# Patient Record
Sex: Female | Born: 1965 | Race: Black or African American | Hispanic: No | Marital: Married | Smoking: Never smoker
Health system: Southern US, Community
[De-identification: ages and names within clinical notes are randomized; demographics above are authoritative.]

## PROBLEM LIST (undated history)

## (undated) HISTORY — PX: CHOLECYSTECTOMY: SHX55

---

## 1997-06-25 ENCOUNTER — Inpatient Hospital Stay (HOSPITAL_COMMUNITY): Admission: AD | Admit: 1997-06-25 | Discharge: 1997-06-25 | Payer: Self-pay | Admitting: Obstetrics & Gynecology

## 1997-06-27 ENCOUNTER — Other Ambulatory Visit: Admission: RE | Admit: 1997-06-27 | Discharge: 1997-06-27 | Payer: Self-pay | Admitting: Obstetrics & Gynecology

## 1997-07-15 ENCOUNTER — Other Ambulatory Visit: Admission: RE | Admit: 1997-07-15 | Discharge: 1997-07-15 | Payer: Self-pay | Admitting: Obstetrics and Gynecology

## 1997-08-03 ENCOUNTER — Other Ambulatory Visit: Admission: RE | Admit: 1997-08-03 | Discharge: 1997-08-03 | Payer: Self-pay | Admitting: Obstetrics and Gynecology

## 1997-08-24 ENCOUNTER — Other Ambulatory Visit: Admission: RE | Admit: 1997-08-24 | Discharge: 1997-08-24 | Payer: Self-pay | Admitting: Obstetrics and Gynecology

## 1998-02-28 ENCOUNTER — Other Ambulatory Visit: Admission: RE | Admit: 1998-02-28 | Discharge: 1998-02-28 | Payer: Self-pay | Admitting: Obstetrics and Gynecology

## 1998-04-17 ENCOUNTER — Inpatient Hospital Stay (HOSPITAL_COMMUNITY): Admission: AD | Admit: 1998-04-17 | Discharge: 1998-04-17 | Payer: Self-pay | Admitting: Obstetrics and Gynecology

## 1998-04-17 ENCOUNTER — Encounter: Payer: Self-pay | Admitting: Obstetrics and Gynecology

## 1998-06-20 ENCOUNTER — Encounter: Payer: Self-pay | Admitting: Obstetrics and Gynecology

## 1998-06-20 ENCOUNTER — Ambulatory Visit (HOSPITAL_COMMUNITY): Admission: RE | Admit: 1998-06-20 | Discharge: 1998-06-20 | Payer: Self-pay | Admitting: Obstetrics and Gynecology

## 1998-11-09 ENCOUNTER — Inpatient Hospital Stay (HOSPITAL_COMMUNITY): Admission: AD | Admit: 1998-11-09 | Discharge: 1998-11-11 | Payer: Self-pay | Admitting: *Deleted

## 1998-11-12 ENCOUNTER — Encounter (HOSPITAL_COMMUNITY): Admission: RE | Admit: 1998-11-12 | Discharge: 1999-02-10 | Payer: Self-pay | Admitting: *Deleted

## 1999-02-13 ENCOUNTER — Encounter (HOSPITAL_COMMUNITY): Admission: RE | Admit: 1999-02-13 | Discharge: 1999-05-14 | Payer: Self-pay | Admitting: *Deleted

## 1999-03-22 ENCOUNTER — Emergency Department (HOSPITAL_COMMUNITY): Admission: EM | Admit: 1999-03-22 | Discharge: 1999-03-22 | Payer: Self-pay | Admitting: Emergency Medicine

## 1999-05-16 ENCOUNTER — Encounter: Admission: RE | Admit: 1999-05-16 | Discharge: 1999-08-14 | Payer: Self-pay | Admitting: *Deleted

## 1999-06-24 ENCOUNTER — Emergency Department (HOSPITAL_COMMUNITY): Admission: EM | Admit: 1999-06-24 | Discharge: 1999-06-24 | Payer: Self-pay | Admitting: Emergency Medicine

## 1999-09-13 ENCOUNTER — Encounter: Admission: RE | Admit: 1999-09-13 | Discharge: 1999-12-12 | Payer: Self-pay | Admitting: *Deleted

## 2016-09-27 ENCOUNTER — Encounter (HOSPITAL_COMMUNITY): Payer: Self-pay

## 2016-09-27 ENCOUNTER — Emergency Department (HOSPITAL_COMMUNITY)
Admission: EM | Admit: 2016-09-27 | Discharge: 2016-09-27 | Disposition: A | Discharge status: Home / Self Care | Payer: Private Health Insurance - Indemnity | Attending: Emergency Medicine | Admitting: Emergency Medicine

## 2016-09-27 ENCOUNTER — Emergency Department (HOSPITAL_COMMUNITY): Payer: Private Health Insurance - Indemnity

## 2016-09-27 DIAGNOSIS — Y929 Unspecified place or not applicable: Secondary | ICD-10-CM | POA: Insufficient documentation

## 2016-09-27 DIAGNOSIS — X500XXA Overexertion from strenuous movement or load, initial encounter: Secondary | ICD-10-CM | POA: Diagnosis not present

## 2016-09-27 DIAGNOSIS — S46912A Strain of unspecified muscle, fascia and tendon at shoulder and upper arm level, left arm, initial encounter: Secondary | ICD-10-CM | POA: Diagnosis not present

## 2016-09-27 DIAGNOSIS — Y9389 Activity, other specified: Secondary | ICD-10-CM | POA: Diagnosis not present

## 2016-09-27 DIAGNOSIS — R030 Elevated blood-pressure reading, without diagnosis of hypertension: Secondary | ICD-10-CM | POA: Diagnosis not present

## 2016-09-27 DIAGNOSIS — Z9049 Acquired absence of other specified parts of digestive tract: Secondary | ICD-10-CM | POA: Insufficient documentation

## 2016-09-27 DIAGNOSIS — Y998 Other external cause status: Secondary | ICD-10-CM | POA: Diagnosis not present

## 2016-09-27 DIAGNOSIS — S40912A Unspecified superficial injury of left shoulder, initial encounter: Secondary | ICD-10-CM | POA: Diagnosis present

## 2016-09-27 NOTE — ED Triage Notes (Signed)
Patient came in with Left shoulder pain. She has had this shoulder pain on and off for a year, however she was cleaning yesterday and last night it was "excruciating". She c/o pain that is worse with movement.

## 2016-09-27 NOTE — Discharge Instructions (Signed)
Place an ice pack on your left shoulder 4 times daily for 30 minutes at a time. Take Advil as directed for pain. It is important to try to move your shoulder as much as possible, otherwise it will become stiff. If having significant pain in a week, contact your primary care physician to arrange to be seen. You should get your blood pressure recheck within the next 3 weeks. Today's was elevated at 147/94

## 2016-09-27 NOTE — ED Provider Notes (Signed)
[facility]-EMERGENCY DEPT Provider Note   CSN: [id number] Arrival date & time: 09/27/16  0814     History   Chief Complaint Chief Complaint  Patient presents with  . Shoulder Pain    Left    HPI Zoe Dyer is a 51 y.o. female.  HPI complains of left shoulder pain intermittently for one year. Her shoulder pain became worse on July 4 when she moved a box. Pain is left shoulder nonradiating. Worse with movement improved with remaining still. No other associated symptoms. No treatment prior to coming.  History reviewed. No pertinent past medical history.  There are no active problems to display for this patient.   Past Surgical History:  Procedure Laterality Date  . CHOLECYSTECTOMY      OB History    No data available       Home Medications    Prior to Admission medications   Not on File    Family History History reviewed. No pertinent family history.  Social History Social History  Substance Use Topics  . Smoking status: Never Smoker  . Smokeless tobacco: Never Used  . Alcohol use Yes     Comment: occasionally/socially    No illicit drug use Allergies   Other; Shellfish allergy; and Sudafed [pseudoephedrine hcl] Sudafed causes heart to race  Review of Systems Review of Systems  Constitutional: Negative.   Respiratory: Negative.   Cardiovascular: Negative.   Musculoskeletal: Positive for arthralgias.       Left shoulder pain     Physical Exam Updated Vital Signs BP (!) 148/104 (BP Location: Right Arm)   Pulse 81   Temp 98.2 F (36.8 C) (Oral)   Resp 16   Ht 5\' 4"  (1.626 m)   Wt 104.3 kg (230 lb)   SpO2 100%   BMI 39.48 kg/m   Physical Exam  Constitutional: She is oriented to person, place, and time. She appears well-developed and well-nourished.  HENT:  Head: Normocephalic and atraumatic.  Eyes: EOM are normal.  Neck: Neck supple.  Cardiovascular: Normal rate.   Murmur heard. Pulmonary/Chest: Breath sounds normal.  Abdominal:  She exhibits no distension.  Musculoskeletal:  Left upper extremity tender at shoulder. Limited range of motion shoulder secondary to pain. No swelling no deformity radial pulse 2+ good capillary refill. All other extremities without tenderness or deformity, neurovascularly intact  Neurological: She is oriented to person, place, and time.  Skin: Skin is warm and dry. Capillary refill takes less than 2 seconds.  Psychiatric: She has a normal mood and affect.  Vitals reviewed.    ED Treatments / Results  Labs (all labs ordered are listed, but only abnormal results are displayed) Labs Reviewed - No data to display  EKG  EKG Interpretation None       Radiology No results found.  Procedures Procedures (including critical care time)  Medications Ordered in ED Medications - No data to display X-ray viewed by me No results found for this or any previous visit. Dg Shoulder Left  Result Date: 09/27/2016 CLINICAL DATA:  Pulling sensation left shoulder. EXAM: LEFT SHOULDER - 2+ VIEW COMPARISON:  No recent prior . FINDINGS: No acute bony or joint abnormality identified. No evidence of fracture or dislocation. IMPRESSION: No acute abnormality. Electronically Signed   By: Maisie Fushomas  Register   On: 09/27/2016 09:14    Initial Impression / Assessment and Plan / ED Course  I have reviewed the triage vital signs and the nursing notes.  Pertinent labs & imaging results that  were available during my care of the patient were reviewed by me and considered in my medical decision making (see chart for details).     Declines pain medicine Plan ice. Ibuprofen. Follow up with PMD if not improved in a week. Blood pressure recheck 3 weeks Final Clinical Impressions(s) / ED Diagnoses  Diagnosis #1 left shoulder strain #2 elevated blood pressure Final diagnoses:  None    New Prescriptions New Prescriptions   No medications on file     Doug Sou, MD 09/27/16 [phone]

## 2019-09-02 ENCOUNTER — Ambulatory Visit
Admission: RE | Admit: 2019-09-02 | Discharge: 2019-09-02 | Disposition: A | Discharge status: Home / Self Care | Payer: Private Health Insurance - Indemnity | Source: Ambulatory Visit | Attending: Family Medicine | Admitting: Family Medicine

## 2019-09-02 ENCOUNTER — Other Ambulatory Visit: Payer: Self-pay | Admitting: Family Medicine

## 2019-09-02 DIAGNOSIS — M25562 Pain in left knee: Secondary | ICD-10-CM

## 2021-03-23 ENCOUNTER — Other Ambulatory Visit: Payer: Self-pay

## 2021-03-23 ENCOUNTER — Emergency Department (HOSPITAL_BASED_OUTPATIENT_CLINIC_OR_DEPARTMENT_OTHER)
Admission: EM | Admit: 2021-03-23 | Discharge: 2021-03-23 | Disposition: A | Discharge status: Home / Self Care | Payer: 59 | Attending: Emergency Medicine | Admitting: Emergency Medicine

## 2021-03-23 ENCOUNTER — Encounter (HOSPITAL_BASED_OUTPATIENT_CLINIC_OR_DEPARTMENT_OTHER): Payer: Self-pay | Admitting: Emergency Medicine

## 2021-03-23 ENCOUNTER — Emergency Department (HOSPITAL_BASED_OUTPATIENT_CLINIC_OR_DEPARTMENT_OTHER): Payer: 59 | Admitting: Radiology

## 2021-03-23 DIAGNOSIS — M25511 Pain in right shoulder: Secondary | ICD-10-CM | POA: Insufficient documentation

## 2021-03-23 DIAGNOSIS — X500XXA Overexertion from strenuous movement or load, initial encounter: Secondary | ICD-10-CM | POA: Insufficient documentation

## 2021-03-23 MED ORDER — IBUPROFEN 600 MG PO TABS: 600.0000 mg | ORAL_TABLET | Freq: Four times a day (QID) | ORAL | 0 refills | Status: AC | PRN

## 2021-03-23 NOTE — ED Provider Notes (Signed)
[facility] EMERGENCY DEPT Provider Note   CSN: [id number] Arrival date & time: 03/23/21  7829     History Chief Complaint  Patient presents with   Shoulder Pain    Zoe Dyer is a 55 y.o. female with no significant past medical history who presents to the ED complaining of right shoulder pain x3-4 days.  Patient notes that yesterday she lifted a heavy container of juice which worsened her symptoms.  Patient denies injury, trauma. Pt is right hand dominant. Has tried ice with no relief for her symptoms.  Denies fever, chills, color change, wound, elbow pain, wrist pain.   The history is provided by the patient. No language interpreter was used.  Shoulder Pain Location:  Shoulder Shoulder location:  R shoulder Injury: no   Pain details:    Quality:  Unable to specify   Radiates to:  Does not radiate   Severity:  Mild   Onset quality:  Gradual   Duration:  4 days   Timing:  Intermittent   Progression:  Unchanged Handedness:  Right-handed Dislocation: no   Foreign body present:  No foreign bodies Prior injury to area:  No Relieved by:  None tried Worsened by:  Movement Ineffective treatments:  Ice Associated symptoms: decreased range of motion   Associated symptoms: no fever and no swelling       History reviewed. No pertinent past medical history.  There are no problems to display for this patient.   Past Surgical History:  Procedure Laterality Date   CHOLECYSTECTOMY       OB History   No obstetric history on file.     No family history on file.  Social History   Tobacco Use   Smoking status: Never   Smokeless tobacco: Never  Vaping Use   Vaping Use: Never used  Substance Use Topics   Alcohol use: Yes    Comment: occasionally/socially   Drug use: No    Home Medications Prior to Admission medications   Medication Sig Start Date End Date Taking? Authorizing Provider  ibuprofen (ADVIL) 600 MG tablet Take 1 tablet (600 mg  total) by mouth every 6 (six) hours as needed. 03/23/21  Yes Takeria Marquina A, PA-C    Allergies    Other, Shellfish allergy, and Sudafed [pseudoephedrine hcl]  Review of Systems   Review of Systems  Constitutional:  Negative for chills and fever.  Gastrointestinal:  Negative for nausea and vomiting.  Musculoskeletal:  Positive for arthralgias. Negative for joint swelling.  Skin:  Negative for color change, rash and wound.  All other systems reviewed and are negative.  Physical Exam Updated Vital Signs BP (!) 180/107 (BP Location: Right Arm)    Pulse 78    Temp 98.8 F (37.1 C) (Oral)    Resp 17    Ht 5\' 4"  (1.626 m)    Wt 108.9 kg    SpO2 100%    BMI 41.20 kg/m   Physical Exam Vitals and nursing note reviewed.  Constitutional:      General: She is not in acute distress.    Appearance: Normal appearance.  Eyes:     General: No scleral icterus.    Extraocular Movements: Extraocular movements intact.  Cardiovascular:     Rate and Rhythm: Normal rate.  Pulmonary:     Effort: Pulmonary effort is normal. No respiratory distress.  Musculoskeletal:     Right shoulder: Tenderness present. No swelling, deformity, laceration, bony tenderness or crepitus. Decreased range of motion. Normal  strength. Normal pulse.     Left shoulder: Normal.     Cervical back: Neck supple.     Comments: Tenderness to palpation to right bicep tendon.  Decreased range of motion of right shoulder secondary to pain.  No overlying skin changes.  Radial pulses intact bilaterally.  Strength and sensation intact to bilateral upper extremities.  Skin:    General: Skin is warm and dry.     Findings: No bruising, erythema or rash.  Neurological:     Mental Status: She is alert.  Psychiatric:        Behavior: Behavior normal.    ED Results / Procedures / Treatments   Labs (all labs ordered are listed, but only abnormal results are displayed) Labs Reviewed - No data to display  EKG None  Radiology DG  Shoulder Right  Result Date: 03/23/2021 CLINICAL DATA:  Right shoulder pain for the past 5 days.  No injury. EXAM: RIGHT SHOULDER - 2+ VIEW COMPARISON:  None. FINDINGS: No acute fracture or dislocation. Joint spaces are preserved. 1 cm calcification overlying the humeral head in internal rotation, not clearly visualized on the transscapular view. Soft tissues are unremarkable. IMPRESSION: 1. 1 cm calcification overlying the humeral head could reflect calcific tendinitis. 2. No acute osseous abnormality or significant degenerative changes. Electronically Signed   By: Obie Dredge M.D.   On: 03/23/2021 11:59    Procedures Procedures   Medications Ordered in ED Medications - No data to display  ED Course  I have reviewed the triage vital signs and the nursing notes.  Pertinent labs & imaging results that were available during my care of the patient were reviewed by me and considered in my medical decision making (see chart for details).    MDM Rules/Calculators/A&P                         Patient with right shoulder pain onset 3-4 days.  Patient lifted heavy item yesterday which exacerbated her right shoulder pain. On exam, patient with tenderness to palpation to right bicep tendon.  Decreased range of motion of right shoulder secondary to pain.  No overlying skin changes.  Strength and sensation intact to bilateral upper and lower extremities.  Radial pulses intact bilaterally. Differential diagnosis includes strain, arthritis, fracture, dislocation, tendinitis.  Right shoulder xray notable for tendinitis and negative for acute fractures or dislocations.  Patient afebrile, less likely septic arthritis.  Less likely osteoarthritis due to no radiographic findings.  This is likely acute tendinitis of right shoulder.    Information for on-call orthopedist given to patient today.  Prescription for ibuprofen given.  Supportive care measures and strict return precautions discussed with patient today.   Patient acknowledges and verbalized understanding.  Patient appears safe for discharge at this time.  Follow-up as indicated in discharge paperwork.   Final Clinical Impression(s) / ED Diagnoses Final diagnoses:  Acute pain of right shoulder    Rx / DC Orders ED Discharge Orders          Ordered    ibuprofen (ADVIL) 600 MG tablet  Every 6 hours PRN        03/23/21 1221             Alto Gandolfo A, PA-C 03/23/21 1302    Terald Sleeper, MD 03/23/21 [phone]

## 2021-03-23 NOTE — Discharge Instructions (Addendum)
Your shoulder x-ray today was negative for fracture or dislocation in the ED.  You will be prescribed ibuprofen, take as directed.  You may apply ice to the affected area for 15 minutes at a time.  Ensure to place a barrier between your skin and the ice.  Take as directed.  You may follow-up with your primary care provider as needed.  Attached is information for the on-call orthopedist, call today and set up follow-up appointment regarding your symptoms.  Return to the ED if you are experiencing increasing/worsening fever, inability to move shoulder, or worsening symptoms.

## 2021-03-23 NOTE — ED Triage Notes (Signed)
R shoulder pain x 3 days. No known injury but did carry something heavy. States her ROM is decreased.

## 2022-06-27 IMAGING — DX DG SHOULDER 2+V*R*
2 series · 2 of 2 positions shown · non-contrast
Comparison: None.

CLINICAL DATA: Right shoulder pain for the past 5 days.  No injury.

EXAM:
RIGHT SHOULDER - 2+ VIEW

[shoulder grashey]
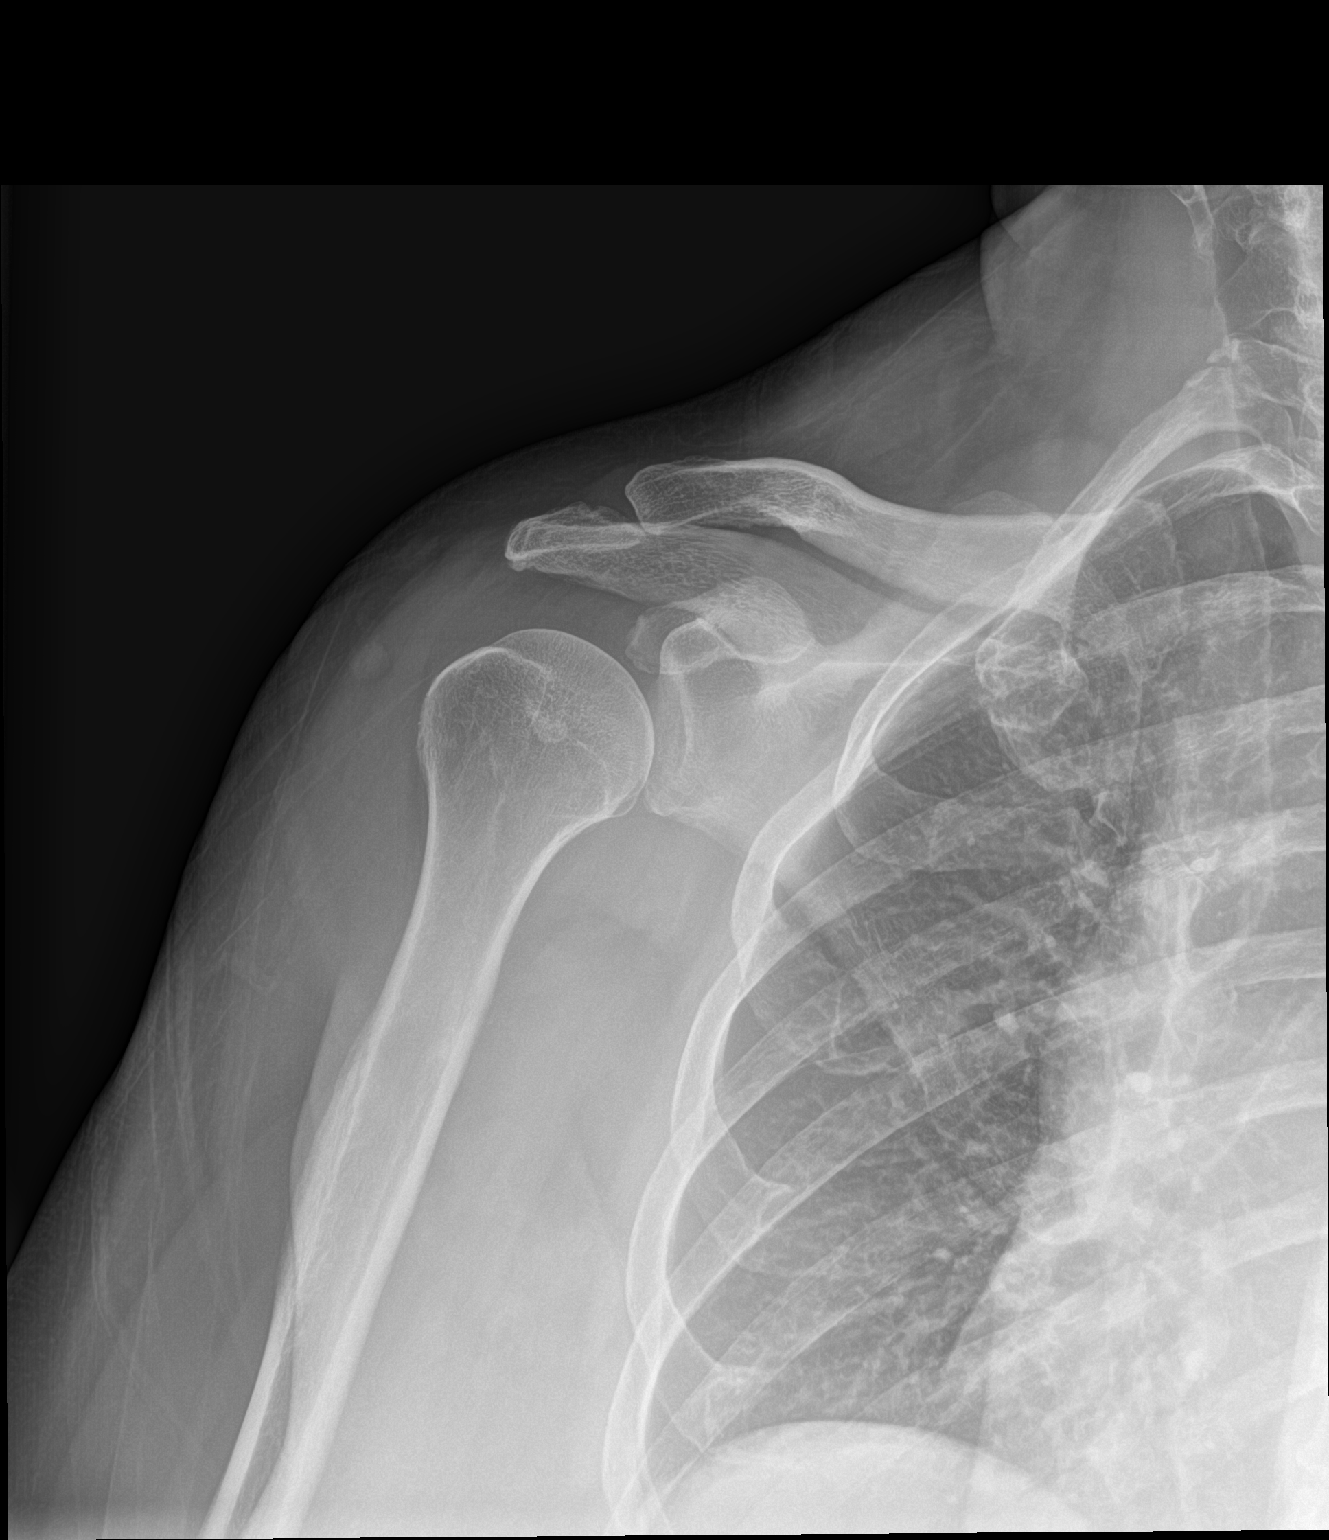

[shoulder y view]
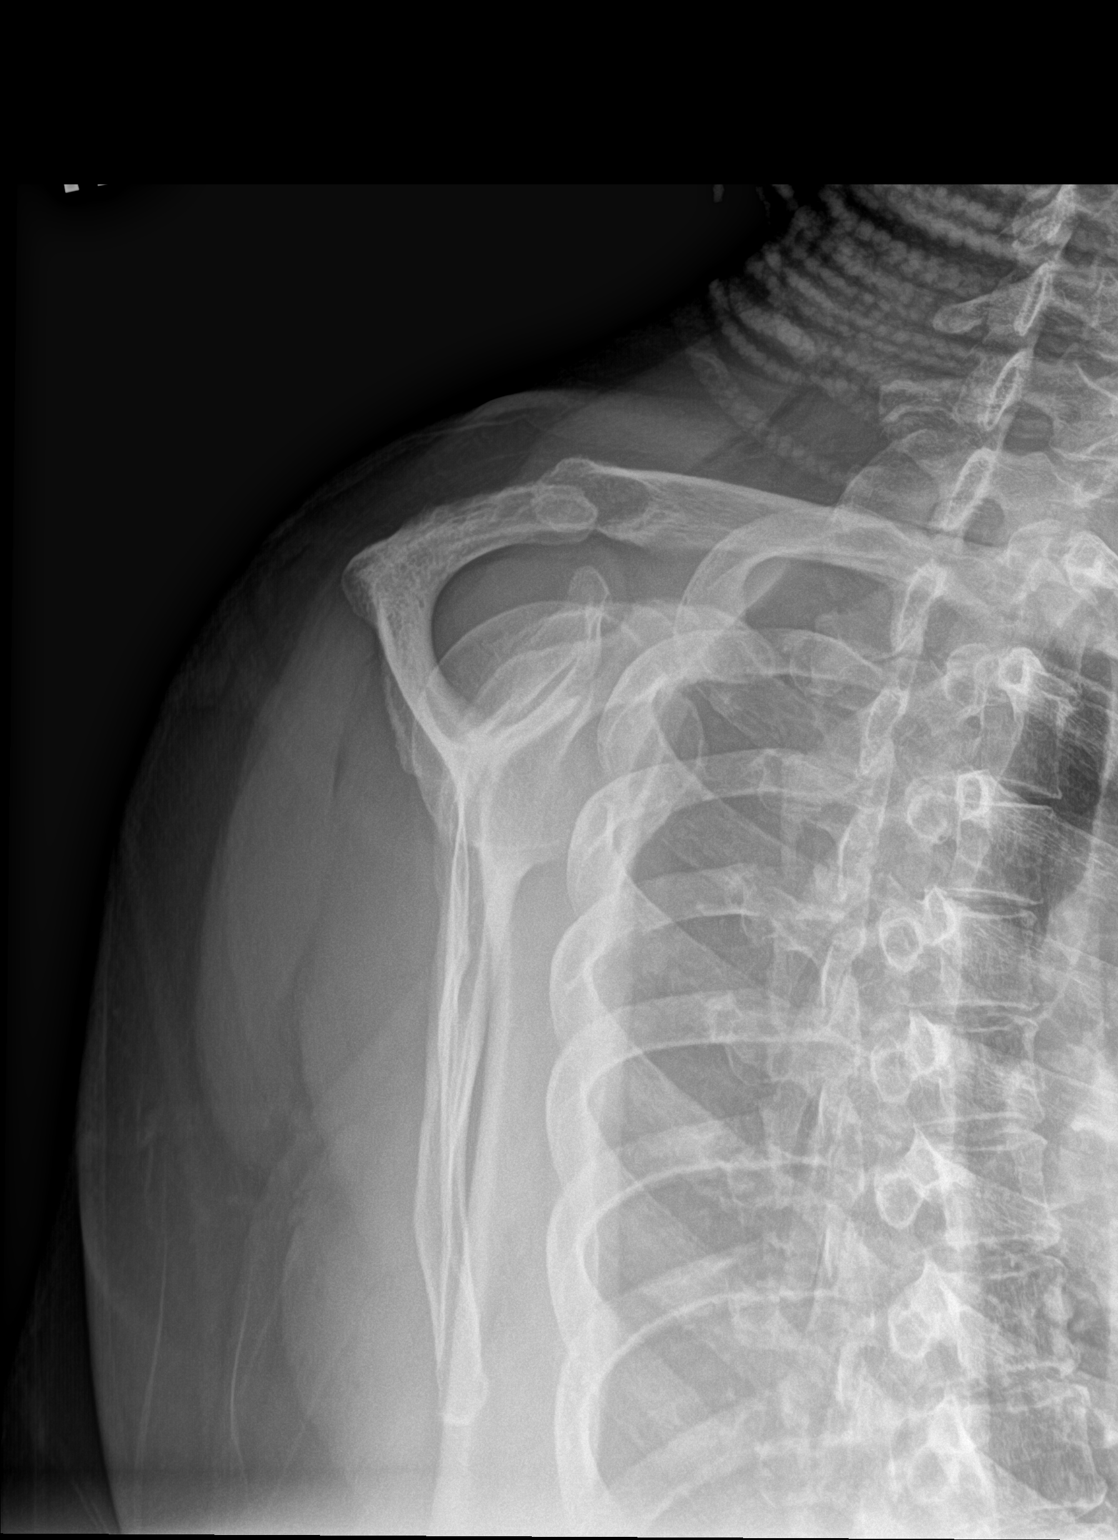

[2 of 2 positions shown; findings below may reference images not displayed]

FINDINGS: No acute fracture or dislocation. Joint spaces are preserved. 1 cm
calcification overlying the humeral head in internal rotation, not
clearly visualized on the transscapular view. Soft tissues are
unremarkable.
IMPRESSION: 1. 1 cm calcification overlying the humeral head could reflect
calcific tendinitis.
2. No acute osseous abnormality or significant degenerative changes.

## 2022-12-11 ENCOUNTER — Encounter (HOSPITAL_BASED_OUTPATIENT_CLINIC_OR_DEPARTMENT_OTHER): Payer: Self-pay

## 2022-12-11 ENCOUNTER — Emergency Department (HOSPITAL_BASED_OUTPATIENT_CLINIC_OR_DEPARTMENT_OTHER): Payer: 59

## 2022-12-11 ENCOUNTER — Other Ambulatory Visit: Payer: Self-pay

## 2022-12-11 ENCOUNTER — Emergency Department (HOSPITAL_BASED_OUTPATIENT_CLINIC_OR_DEPARTMENT_OTHER)
Admission: EM | Admit: 2022-12-11 | Discharge: 2022-12-11 | Disposition: A | Discharge status: Home / Self Care | Payer: 59 | Attending: Emergency Medicine | Admitting: Emergency Medicine

## 2022-12-11 DIAGNOSIS — R079 Chest pain, unspecified: Secondary | ICD-10-CM | POA: Diagnosis present

## 2022-12-11 DIAGNOSIS — R9431 Abnormal electrocardiogram [ECG] [EKG]: Secondary | ICD-10-CM | POA: Diagnosis not present

## 2022-12-11 LAB — PREGNANCY, URINE: Preg Test, Ur: NEGATIVE

## 2022-12-11 LAB — CBC
HCT: 37.9 % (ref 36.0–46.0)
Hemoglobin: 12.2 g/dL (ref 12.0–15.0)
MCH: 31.3 pg (ref 26.0–34.0)
MCHC: 32.2 g/dL (ref 30.0–36.0)
MCV: 97.2 fL (ref 80.0–100.0)
Platelets: 252 10*3/uL (ref 150–400)
RBC: 3.9 MIL/uL (ref 3.87–5.11)
RDW: 11.9 % (ref 11.5–15.5)
WBC: 9.1 10*3/uL (ref 4.0–10.5)
nRBC: 0 % (ref 0.0–0.2)

## 2022-12-11 LAB — BASIC METABOLIC PANEL WITH GFR
Anion gap: 11 (ref 5–15)
BUN: 9 mg/dL (ref 6–20)
CO2: 26 mmol/L (ref 22–32)
Calcium: 9.5 mg/dL (ref 8.9–10.3)
Chloride: 102 mmol/L (ref 98–111)
Creatinine, Ser: 0.8 mg/dL (ref 0.44–1.00)
GFR, Estimated: >60 mL/min (ref >60)
Glucose, Bld: 97 mg/dL (ref 70–99)
Potassium: 3.5 mmol/L (ref 3.5–5.1)
Sodium: 139 mmol/L (ref 135–145)

## 2022-12-11 LAB — TROPONIN I (HIGH SENSITIVITY): Troponin I (High Sensitivity): 4 ng/L (ref <18)

## 2022-12-11 NOTE — ED Provider Notes (Signed)
[place] EMERGENCY DEPARTMENT AT [facility] Provider Note   CSN: [id number] Arrival date & time: 12/11/22  1939     History  Chief Complaint  Patient presents with   Abnormal ECG    Zoe Dyer is a 57 y.o. female.  Patient to ED from Urgent Care for evaluation of chest pain that has been occurring regularly over the last 2 months. She reports the pain is associated with any time she eats anything. No nausea, SOB, radiation of the pain. She has not tried using any medications for relief. Sent here from Urgent Care out of concern for an abnormal EKG in the setting of chest pain.    The history is provided by the patient. No language interpreter was used.       Home Medications Prior to Admission medications   Medication Sig Start Date End Date Taking? Authorizing Provider  ibuprofen (ADVIL) 600 MG tablet Take 1 tablet (600 mg total) by mouth every 6 (six) hours as needed. 03/23/21   Blue, Soijett A, PA-C      Allergies    Other, Shellfish allergy, and Sudafed [pseudoephedrine hcl]    Review of Systems   Review of Systems  Physical Exam Updated Vital Signs BP (!) 177/92   Pulse 75   Temp 98 F (36.7 C)   Resp 18   Ht 5\' 4"  (1.626 m)   Wt 99.8 kg   SpO2 96%   BMI 37.76 kg/m  Physical Exam Vitals and nursing note reviewed.  Constitutional:      General: She is not in acute distress.    Appearance: Normal appearance. She is not ill-appearing.  HENT:     Head: Normocephalic.  Cardiovascular:     Rate and Rhythm: Normal rate and regular rhythm.     Heart sounds: No murmur heard. Pulmonary:     Effort: Pulmonary effort is normal.     Breath sounds: No wheezing, rhonchi or rales.  Chest:     Chest wall: No tenderness.  Abdominal:     General: There is no distension.     Palpations: Abdomen is soft.     Tenderness: There is no abdominal tenderness.  Musculoskeletal:     Cervical back: Normal range of motion.     Right lower leg: No edema.      Left lower leg: No edema.  Skin:    General: Skin is warm and dry.  Neurological:     Mental Status: She is alert and oriented to person, place, and time.     ED Results / Procedures / Treatments   Labs (all labs ordered are listed, but only abnormal results are displayed) Labs Reviewed  BASIC METABOLIC PANEL  CBC  PREGNANCY, URINE  TROPONIN I (HIGH SENSITIVITY)  TROPONIN I (HIGH SENSITIVITY)   Results for orders placed or performed during the hospital encounter of 12/11/22  Basic metabolic panel  Result Value Ref Range   Sodium 139 135 - 145 mmol/L   Potassium 3.5 3.5 - 5.1 mmol/L   Chloride 102 98 - 111 mmol/L   CO2 26 22 - 32 mmol/L   Glucose, Bld 97 70 - 99 mg/dL   BUN 9 6 - 20 mg/dL   Creatinine, Ser 9.56 0.44 - 1.00 mg/dL   Calcium 9.5 8.9 - 38.7 mg/dL   GFR, Estimated >56 >43 mL/min   Anion gap 11 5 - 15  CBC  Result Value Ref Range   WBC 9.1 4.0 - 10.5 K/uL  RBC 3.90 3.87 - 5.11 MIL/uL   Hemoglobin 12.2 12.0 - 15.0 g/dL   HCT 95.2 84.1 - 32.4 %   MCV 97.2 80.0 - 100.0 fL   MCH 31.3 26.0 - 34.0 pg   MCHC 32.2 30.0 - 36.0 g/dL   RDW 40.1 02.7 - 25.3 %   Platelets 252 150 - 400 K/uL   nRBC 0.0 0.0 - 0.2 %  Pregnancy, urine  Result Value Ref Range   Preg Test, Ur NEGATIVE NEGATIVE  Troponin I (High Sensitivity)  Result Value Ref Range   Troponin I (High Sensitivity) 4 <18 ng/L    EKG EKG Interpretation Date/Time:  Wednesday December 11 2022 19:59:31 EDT Ventricular Rate:  73 PR Interval:  138 QRS Duration:  84 QT Interval:  396 QTC Calculation: 436 R Axis:   47  Text Interpretation: Normal sinus rhythm Normal ECG No previous ECGs available Confirmed by Glyn Ade [phone]) on 12/11/2022 10:36:06 PM  Radiology No results found.  Procedures Procedures    Medications Ordered in ED Medications - No data to display  ED Course/ Medical Decision Making/ A&P Clinical Course as of 12/11/22 2250  Wed Dec 11, 2022  2246 Patient with 2  months of reoccurring chest pain and a reported abnormal EKG by Urgent Care. This EKG was brought with her and shows infrequent PVC's on my review. EKG in the ED is NSR. Troponin is negative. No abnormal lab findings. Patient was updated on evaluation. Recommended Pepcid twice daily and PCP recheck in one week for recheck.  [SU]    Clinical Course User Index [SU] Elpidio Anis, PA-C                                 Medical Decision Making Amount and/or Complexity of Data Reviewed Labs: ordered. Radiology: ordered.           Final Clinical Impression(s) / ED Diagnoses Final diagnoses:  Nonspecific chest pain    Rx / DC Orders ED Discharge Orders     None         Elpidio Anis, PA-C 12/11/22 2250    Glyn Ade, MD 12/14/22 1458

## 2022-12-11 NOTE — Discharge Instructions (Signed)
Try taking Pepcid 20 mg twice daily for the next week and following up with your doctor at that time for recheck of symptoms.   Return to the ED if you develop any new or concerning symptoms at any time.

## 2022-12-11 NOTE — ED Triage Notes (Signed)
Pov from home, A&O x 4, GCS 15, amb to triage  Pt sts that she's been having chest pain x 2 months, better after belching. Pt denies chest pain currently, sent from UC for abnormal EKG.
